# Patient Record
Sex: Female | Born: 2000 | Race: Black or African American | Hispanic: No | Marital: Single | Smoking: Never smoker
Health system: Southern US, Community
[De-identification: ages and names within clinical notes are randomized; demographics above are authoritative.]

---
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
## 2023-06-26 ENCOUNTER — Encounter (HOSPITAL_COMMUNITY): Payer: Self-pay

## 2023-06-26 ENCOUNTER — Ambulatory Visit (HOSPITAL_COMMUNITY): Admission: EM | Admit: 2023-06-26 | Discharge: 2023-06-26 | Disposition: A | Discharge status: Home / Self Care

## 2023-06-26 DIAGNOSIS — S46812A Strain of other muscles, fascia and tendons at shoulder and upper arm level, left arm, initial encounter: Secondary | ICD-10-CM | POA: Diagnosis not present

## 2023-06-26 MED ORDER — BACLOFEN 20 MG PO TABS: 20.0000 mg | ORAL_TABLET | Freq: Three times a day (TID) | ORAL | 0 refills | Status: DC

## 2023-06-26 MED ORDER — BACLOFEN 10 MG PO TABS: 10.0000 mg | ORAL_TABLET | Freq: Three times a day (TID) | ORAL | 0 refills | Status: AC

## 2023-06-26 MED ORDER — NAPROXEN 500 MG PO TABS: 500.0000 mg | ORAL_TABLET | Freq: Two times a day (BID) | ORAL | 0 refills | Status: AC

## 2023-06-26 NOTE — Discharge Instructions (Addendum)
  1. Strain of left subscapular muscle, initial encounter (Primary) - naproxen (NAPROSYN) 500 MG tablet; Take 1 tablet (500 mg total) by mouth 2 (two) times daily.  Dispense: 30 tablet; Refill: 0 - baclofen (LIORESAL) 10 MG tablet; Take 1 tablet (10 mg total) by mouth 3 (three) times daily.  Dispense: 30 tablet; Refill: 0 - Apply heat 2-3 times a day for 10 to 15 minutes, especially before exercise to warm up your muscles and increased blood flow for activity. - No heavy lifting or strenuous physical activity for the next 24 to 48 hours or until symptoms fully resolved. -Continue to monitor symptoms for any change in severity if there is any escalation of current symptoms or development of new symptoms follow-up in ER for further evaluation and management.

## 2023-06-26 NOTE — ED Provider Notes (Signed)
 [facility]  Note:  This document was prepared using Dragon voice recognition software and may include unintentional dictation errors.  MRN: [id number] DOB: February 28, 2000  Subjective:   Karen Garrison is a 23 y.o. female presenting for left sided mid/upper back pain x 2 days.  Patient denies any known injury has been applying ice and heat at home with minimal improvement to symptoms.  Patient states that pain is underneath her scapula on the left side and is worse with movement, reports mild to moderate muscle spasms.  Patient does admit to exercise and weight lifting but states that she does not remember any specific injury or trauma.  Patient has not been taking any over-the-counter medication to treat symptoms.  No current facility-administered medications for this encounter.  Current Outpatient Medications:    naproxen (NAPROSYN) 500 MG tablet, Take 1 tablet (500 mg total) by mouth 2 (two) times daily., Disp: 30 tablet, Rfl: 0   baclofen (LIORESAL) 10 MG tablet, Take 1 tablet (10 mg total) by mouth 3 (three) times daily., Disp: 30 tablet, Rfl: 0   cetirizine  (ZYRTEC  ALLERGY) 10 MG tablet, Take 1 tablet (10 mg total) by mouth daily., Disp: 30 tablet, Rfl: 2   fluticasone  (FLONASE ) 50 MCG/ACT nasal spray, Place 1 spray into both nostrils daily., Disp: 11.1 mL, Rfl: 2   No Known Allergies  History reviewed. No pertinent past medical history.   History reviewed. No pertinent surgical history.  History reviewed. No pertinent family history.  Social History   Tobacco Use   Smoking status: Never    Passive exposure: Yes   Smokeless tobacco: Never  Substance Use Topics   Alcohol use: Not Currently   Drug use: Not Currently    ROS Refer to HPI for ROS details.  Objective:   Vitals: BP 119/75 (BP Location: Left Arm)   Pulse 96   Temp 98.1 F (36.7 C) (Oral)   Resp 16   SpO2 97%   Physical Exam Vitals and nursing note reviewed.  Constitutional:      General: She  is not in acute distress.    Appearance: She is well-developed. She is not ill-appearing or toxic-appearing.  HENT:     Head: Normocephalic and atraumatic.  Cardiovascular:     Rate and Rhythm: Normal rate.  Pulmonary:     Effort: Pulmonary effort is normal. No respiratory distress.  Musculoskeletal:     Right shoulder: Normal.     Left shoulder: Tenderness present. No swelling, deformity or bony tenderness. Decreased range of motion. Normal strength. Normal pulse.     Thoracic back: Spasms present. No swelling, tenderness or bony tenderness. Normal range of motion.  Skin:    General: Skin is warm and dry.  Neurological:     General: No focal deficit present.     Mental Status: She is alert and oriented to person, place, and time.  Psychiatric:        Mood and Affect: Mood normal.        Behavior: Behavior normal.     Procedures  No results found for this or any previous visit (from the past 24 hours).  No results found.   Assessment and Plan :     Discharge Instructions       1. Strain of left subscapular muscle, initial encounter (Primary) - naproxen (NAPROSYN) 500 MG tablet; Take 1 tablet (500 mg total) by mouth 2 (two) times daily.  Dispense: 30 tablet; Refill: 0 - baclofen (LIORESAL) 10 MG tablet; Take 1  tablet (10 mg total) by mouth 3 (three) times daily.  Dispense: 30 tablet; Refill: 0 - Apply heat 2-3 times a day for 10 to 15 minutes, especially before exercise to warm up your muscles and increased blood flow for activity. - No heavy lifting or strenuous physical activity for the next 24 to 48 hours or until symptoms fully resolved. -Continue to monitor symptoms for any change in severity if there is any escalation of current symptoms or development of new symptoms follow-up in ER for further evaluation and management.    Letisia Schwalb B Sherilynn Dieu   Gailyn Crook, Keddie B, [place] 06/26/23 1840

## 2023-06-26 NOTE — ED Triage Notes (Signed)
 Pt states upper left back pain for the past 2 days.  Denies any injury. States she has been using ice and heat at home.
